# Patient Record
Sex: Male | Born: 1990 | Race: White | Hispanic: No | Marital: Married | Smoking: Current every day smoker
Health system: Southern US, Community
[De-identification: ages and names within clinical notes are randomized; demographics above are authoritative.]

## PROBLEM LIST (undated history)

## (undated) DIAGNOSIS — G8929 Other chronic pain: Secondary | ICD-10-CM

## (undated) DIAGNOSIS — J45909 Unspecified asthma, uncomplicated: Secondary | ICD-10-CM

## (undated) DIAGNOSIS — M549 Dorsalgia, unspecified: Secondary | ICD-10-CM

## (undated) HISTORY — PX: KNEE SURGERY: SHX244

## (undated) HISTORY — PX: OTHER SURGICAL HISTORY: SHX169

---

## 2013-05-22 ENCOUNTER — Emergency Department: Payer: Self-pay | Admitting: Emergency Medicine

## 2013-07-13 ENCOUNTER — Emergency Department: Payer: Self-pay | Admitting: Emergency Medicine

## 2013-07-14 LAB — GC/CHLAMYDIA PROBE AMP

## 2013-09-03 ENCOUNTER — Emergency Department: Payer: Self-pay | Admitting: Emergency Medicine

## 2013-10-04 ENCOUNTER — Emergency Department: Payer: Self-pay | Admitting: Emergency Medicine

## 2013-12-10 ENCOUNTER — Emergency Department: Payer: Self-pay | Admitting: Emergency Medicine

## 2013-12-10 LAB — URINALYSIS, COMPLETE
BACTERIA: NONE SEEN
Bilirubin,UR: NEGATIVE
Glucose,UR: NEGATIVE mg/dL (ref 0–75)
LEUKOCYTE ESTERASE: NEGATIVE
Nitrite: NEGATIVE
PH: 5 (ref 4.5–8.0)
Specific Gravity: 1.029 (ref 1.003–1.030)
Squamous Epithelial: 2
WBC UR: 19 /HPF (ref 0–5)

## 2013-12-10 LAB — COMPREHENSIVE METABOLIC PANEL
Albumin: 4.4 g/dL (ref 3.4–5.0)
Alkaline Phosphatase: 76 U/L
Anion Gap: 4 — ABNORMAL LOW (ref 7–16)
BUN: 17 mg/dL (ref 7–18)
Bilirubin,Total: 0.6 mg/dL (ref 0.2–1.0)
CHLORIDE: 106 mmol/L (ref 98–107)
Calcium, Total: 9.2 mg/dL (ref 8.5–10.1)
Co2: 26 mmol/L (ref 21–32)
Creatinine: 1.4 mg/dL — ABNORMAL HIGH (ref 0.60–1.30)
EGFR (African American): >60
EGFR (Non-African Amer.): >60
GLUCOSE: 122 mg/dL — AB (ref 65–99)
OSMOLALITY: 275 (ref 275–301)
Potassium: 3.4 mmol/L — ABNORMAL LOW (ref 3.5–5.1)
SGOT(AST): 25 U/L (ref 15–37)
SGPT (ALT): 20 U/L (ref 12–78)
SODIUM: 136 mmol/L (ref 136–145)
TOTAL PROTEIN: 7.6 g/dL (ref 6.4–8.2)

## 2013-12-10 LAB — CBC WITH DIFFERENTIAL/PLATELET
BASOS ABS: 0.1 10*3/uL (ref 0.0–0.1)
BASOS PCT: 0.9 %
EOS ABS: 0.3 10*3/uL (ref 0.0–0.7)
Eosinophil %: 2.6 %
HCT: 45.4 % (ref 40.0–52.0)
HGB: 15 g/dL (ref 13.0–18.0)
LYMPHS ABS: 3.4 10*3/uL (ref 1.0–3.6)
Lymphocyte %: 30.4 %
MCH: 28.3 pg (ref 26.0–34.0)
MCHC: 33.1 g/dL (ref 32.0–36.0)
MCV: 85 fL (ref 80–100)
MONOS PCT: 7.1 %
Monocyte #: 0.8 x10 3/mm (ref 0.2–1.0)
NEUTROS ABS: 6.5 10*3/uL (ref 1.4–6.5)
NEUTROS PCT: 59 %
Platelet: 265 10*3/uL (ref 150–440)
RBC: 5.32 10*6/uL (ref 4.40–5.90)
RDW: 13.3 % (ref 11.5–14.5)
WBC: 11.1 10*3/uL — AB (ref 3.8–10.6)

## 2013-12-21 ENCOUNTER — Emergency Department: Payer: Self-pay

## 2013-12-21 LAB — URINALYSIS, COMPLETE
Bacteria: NONE SEEN
Bilirubin,UR: NEGATIVE
GLUCOSE, UR: NEGATIVE mg/dL (ref 0–75)
KETONE: NEGATIVE
Leukocyte Esterase: NEGATIVE
NITRITE: NEGATIVE
Ph: 6 (ref 4.5–8.0)
Protein: NEGATIVE
SPECIFIC GRAVITY: 1.023 (ref 1.003–1.030)
Squamous Epithelial: NONE SEEN
WBC UR: 1 /HPF (ref 0–5)

## 2014-01-08 ENCOUNTER — Emergency Department: Payer: Self-pay | Admitting: Emergency Medicine

## 2014-01-08 LAB — CBC WITH DIFFERENTIAL/PLATELET
BASOS ABS: 0.1 10*3/uL (ref 0.0–0.1)
Basophil %: 1.1 %
Eosinophil #: 0.4 10*3/uL (ref 0.0–0.7)
Eosinophil %: 4.6 %
HCT: 45.4 % (ref 40.0–52.0)
HGB: 15.1 g/dL (ref 13.0–18.0)
Lymphocyte #: 2.5 10*3/uL (ref 1.0–3.6)
Lymphocyte %: 29.5 %
MCH: 28.2 pg (ref 26.0–34.0)
MCHC: 33.3 g/dL (ref 32.0–36.0)
MCV: 85 fL (ref 80–100)
Monocyte #: 0.7 x10 3/mm (ref 0.2–1.0)
Monocyte %: 8 %
NEUTROS ABS: 4.8 10*3/uL (ref 1.4–6.5)
Neutrophil %: 56.8 %
PLATELETS: 204 10*3/uL (ref 150–440)
RBC: 5.38 10*6/uL (ref 4.40–5.90)
RDW: 13.6 % (ref 11.5–14.5)
WBC: 8.5 10*3/uL (ref 3.8–10.6)

## 2014-01-08 LAB — COMPREHENSIVE METABOLIC PANEL
ALK PHOS: 76 U/L
Albumin: 4.2 g/dL (ref 3.4–5.0)
Anion Gap: 8 (ref 7–16)
BUN: 18 mg/dL (ref 7–18)
Bilirubin,Total: 0.3 mg/dL (ref 0.2–1.0)
CALCIUM: 9.1 mg/dL (ref 8.5–10.1)
CREATININE: 0.95 mg/dL (ref 0.60–1.30)
Chloride: 103 mmol/L (ref 98–107)
Co2: 29 mmol/L (ref 21–32)
EGFR (African American): >60
Glucose: 87 mg/dL (ref 65–99)
OSMOLALITY: 281 (ref 275–301)
POTASSIUM: 3.7 mmol/L (ref 3.5–5.1)
SGOT(AST): 19 U/L (ref 15–37)
SGPT (ALT): 25 U/L (ref 12–78)
Sodium: 140 mmol/L (ref 136–145)
TOTAL PROTEIN: 7.1 g/dL (ref 6.4–8.2)

## 2014-01-08 LAB — URINALYSIS, COMPLETE
BILIRUBIN, UR: NEGATIVE
Bacteria: NONE SEEN
Blood: NEGATIVE
GLUCOSE, UR: NEGATIVE mg/dL (ref 0–75)
Ketone: NEGATIVE
LEUKOCYTE ESTERASE: NEGATIVE
Nitrite: NEGATIVE
PROTEIN: NEGATIVE
Ph: 6 (ref 4.5–8.0)
RBC,UR: 1 /HPF (ref 0–5)
Specific Gravity: 1.017 (ref 1.003–1.030)
Squamous Epithelial: NONE SEEN
WBC UR: NONE SEEN /HPF (ref 0–5)

## 2014-01-12 ENCOUNTER — Emergency Department: Payer: Self-pay | Admitting: Emergency Medicine

## 2014-01-22 ENCOUNTER — Emergency Department: Payer: Self-pay | Admitting: Emergency Medicine

## 2014-01-24 ENCOUNTER — Emergency Department: Payer: Self-pay | Admitting: Emergency Medicine

## 2014-01-28 ENCOUNTER — Emergency Department: Payer: Self-pay | Admitting: Emergency Medicine

## 2014-01-28 LAB — COMPREHENSIVE METABOLIC PANEL
ALBUMIN: 4 g/dL (ref 3.4–5.0)
ALT: 26 U/L (ref 12–78)
ANION GAP: 9 (ref 7–16)
AST: 26 U/L (ref 15–37)
Alkaline Phosphatase: 78 U/L
BUN: 18 mg/dL (ref 7–18)
Bilirubin,Total: 0.4 mg/dL (ref 0.2–1.0)
CO2: 25 mmol/L (ref 21–32)
CREATININE: 1.36 mg/dL — AB (ref 0.60–1.30)
Calcium, Total: 8.4 mg/dL — ABNORMAL LOW (ref 8.5–10.1)
Chloride: 105 mmol/L (ref 98–107)
EGFR (African American): >60
EGFR (Non-African Amer.): >60
GLUCOSE: 118 mg/dL — AB (ref 65–99)
OSMOLALITY: 281 (ref 275–301)
Potassium: 3 mmol/L — ABNORMAL LOW (ref 3.5–5.1)
Sodium: 139 mmol/L (ref 136–145)
Total Protein: 7.4 g/dL (ref 6.4–8.2)

## 2014-01-28 LAB — CBC
HCT: 44.2 % (ref 40.0–52.0)
HGB: 15 g/dL (ref 13.0–18.0)
MCH: 28.8 pg (ref 26.0–34.0)
MCHC: 33.9 g/dL (ref 32.0–36.0)
MCV: 85 fL (ref 80–100)
Platelet: 233 10*3/uL (ref 150–440)
RBC: 5.19 10*6/uL (ref 4.40–5.90)
RDW: 13.7 % (ref 11.5–14.5)
WBC: 8.1 10*3/uL (ref 3.8–10.6)

## 2014-01-28 LAB — TSH: THYROID STIMULATING HORM: 1.64 u[IU]/mL

## 2014-01-28 LAB — ETHANOL
ETHANOL LVL: 26 mg/dL
Ethanol %: 0.026 % (ref 0.000–0.080)

## 2014-01-28 LAB — ACETAMINOPHEN LEVEL: Acetaminophen: 12 ug/mL

## 2014-01-28 LAB — SALICYLATE LEVEL: Salicylates, Serum: 2.7 mg/dL

## 2014-01-29 LAB — URINALYSIS, COMPLETE
BLOOD: NEGATIVE
Bilirubin,UR: NEGATIVE
Glucose,UR: NEGATIVE mg/dL (ref 0–75)
Ketone: NEGATIVE
Leukocyte Esterase: NEGATIVE
Nitrite: NEGATIVE
PH: 5 (ref 4.5–8.0)
Protein: NEGATIVE
RBC,UR: 1 /HPF (ref 0–5)
Specific Gravity: 1.029 (ref 1.003–1.030)
Squamous Epithelial: NONE SEEN

## 2014-01-29 LAB — DRUG SCREEN, URINE
AMPHETAMINES, UR SCREEN: NEGATIVE (ref ?–1000)
Barbiturates, Ur Screen: NEGATIVE (ref ?–200)
Benzodiazepine, Ur Scrn: NEGATIVE (ref ?–200)
COCAINE METABOLITE, UR ~~LOC~~: NEGATIVE (ref ?–300)
Cannabinoid 50 Ng, Ur ~~LOC~~: NEGATIVE (ref ?–50)
MDMA (Ecstasy)Ur Screen: NEGATIVE (ref ?–500)
Methadone, Ur Screen: NEGATIVE (ref ?–300)
Opiate, Ur Screen: POSITIVE (ref ?–300)
PHENCYCLIDINE (PCP) UR S: NEGATIVE (ref ?–25)
Tricyclic, Ur Screen: NEGATIVE (ref ?–1000)

## 2014-02-10 ENCOUNTER — Emergency Department: Payer: Self-pay | Admitting: Emergency Medicine

## 2014-04-27 ENCOUNTER — Encounter (HOSPITAL_COMMUNITY): Payer: Self-pay | Admitting: Emergency Medicine

## 2014-04-27 ENCOUNTER — Emergency Department (HOSPITAL_COMMUNITY)
Admission: EM | Admit: 2014-04-27 | Discharge: 2014-04-27 | Disposition: A | Discharge status: Home / Self Care | Payer: Self-pay | Attending: Emergency Medicine | Admitting: Emergency Medicine

## 2014-04-27 DIAGNOSIS — F10921 Alcohol use, unspecified with intoxication delirium: Secondary | ICD-10-CM

## 2014-04-27 DIAGNOSIS — F10121 Alcohol abuse with intoxication delirium: Secondary | ICD-10-CM | POA: Insufficient documentation

## 2014-04-27 DIAGNOSIS — Z720 Tobacco use: Secondary | ICD-10-CM | POA: Insufficient documentation

## 2014-04-27 DIAGNOSIS — J45909 Unspecified asthma, uncomplicated: Secondary | ICD-10-CM | POA: Insufficient documentation

## 2014-04-27 HISTORY — DX: Unspecified asthma, uncomplicated: J45.909

## 2014-04-27 LAB — CBC WITH DIFFERENTIAL/PLATELET
BASOS ABS: 0 10*3/uL (ref 0.0–0.1)
BASOS PCT: 0 % (ref 0–1)
EOS ABS: 0.2 10*3/uL (ref 0.0–0.7)
EOS PCT: 1 % (ref 0–5)
HEMATOCRIT: 44.1 % (ref 39.0–52.0)
Hemoglobin: 15.6 g/dL (ref 13.0–17.0)
Lymphocytes Relative: 12 % (ref 12–46)
Lymphs Abs: 1.5 10*3/uL (ref 0.7–4.0)
MCH: 30 pg (ref 26.0–34.0)
MCHC: 35.4 g/dL (ref 30.0–36.0)
MCV: 84.8 fL (ref 78.0–100.0)
MONO ABS: 0.6 10*3/uL (ref 0.1–1.0)
Monocytes Relative: 5 % (ref 3–12)
Neutro Abs: 10.5 10*3/uL — ABNORMAL HIGH (ref 1.7–7.7)
Neutrophils Relative %: 82 % — ABNORMAL HIGH (ref 43–77)
PLATELETS: 231 10*3/uL (ref 150–400)
RBC: 5.2 MIL/uL (ref 4.22–5.81)
RDW: 12.6 % (ref 11.5–15.5)
WBC: 12.7 10*3/uL — ABNORMAL HIGH (ref 4.0–10.5)

## 2014-04-27 LAB — ETHANOL: ALCOHOL ETHYL (B): 134 mg/dL — AB (ref 0–11)

## 2014-04-27 LAB — RAPID URINE DRUG SCREEN, HOSP PERFORMED
AMPHETAMINES: NOT DETECTED
Barbiturates: NOT DETECTED
Benzodiazepines: NOT DETECTED
Cocaine: NOT DETECTED
OPIATES: NOT DETECTED
Tetrahydrocannabinol: NOT DETECTED

## 2014-04-27 LAB — BASIC METABOLIC PANEL
ANION GAP: 13 (ref 5–15)
BUN: 16 mg/dL (ref 6–23)
CALCIUM: 9.7 mg/dL (ref 8.4–10.5)
CO2: 26 mEq/L (ref 19–32)
CREATININE: 1.06 mg/dL (ref 0.50–1.35)
Chloride: 104 mEq/L (ref 96–112)
Glucose, Bld: 140 mg/dL — ABNORMAL HIGH (ref 70–99)
Potassium: 4.1 mEq/L (ref 3.7–5.3)
SODIUM: 143 meq/L (ref 137–147)

## 2014-04-27 MED ORDER — SODIUM CHLORIDE 0.9 % IV SOLN
INTRAVENOUS | Status: DC
  Administered 2014-04-27: 05:00:00 via INTRAVENOUS

## 2014-04-27 MED ORDER — ONDANSETRON HCL 4 MG/2ML IJ SOLN
4.0000 mg | Freq: Once | INTRAMUSCULAR | Status: AC
  Administered 2014-04-27: 4 mg via INTRAVENOUS
  Filled 2014-04-27: qty 2

## 2014-04-27 NOTE — ED Provider Notes (Signed)
CSN: [id number]     Arrival date & time 04/27/14  0417 History   First MD Initiated Contact with Patient 04/27/14 0421     Chief Complaint  Patient presents with  . Altered Mental Status   HPI The patient presents to reassure him for altered mental status. The patient was at work this evening. After he finished work, he had a couple of shots of moonshine.  The patient got back to his apartment he became combative. His roommates called 911. EMS found that the patient was initially combative and then became somnolent.  Now that he is at the emergency room, he is alert and awake has no complaints. The patient does not remember becoming combative. The last thing he remembers was having shots with his boss at work. Denies any recent injuries. Has a mild headache but otherwise no complaints. Past Medical History  Diagnosis Date  . Asthma    Past Surgical History  Procedure Laterality Date  . Bicep surgery    . Knee surgery     No family history on file. History  Substance Use Topics  . Smoking status: Current Every Day Smoker -- 2.00 packs/day    Types: Cigarettes  . Smokeless tobacco: Not on file  . Alcohol Use: 4.2 oz/week    7 Cans of beer per week    Review of Systems  All other systems reviewed and are negative.     Allergies  Review of patient's allergies indicates no known allergies.  Home Medications   Prior to Admission medications   Not on File   BP 120/76  Pulse 83  Temp(Src) 97.6 F (36.4 C) (Oral)  Resp 16  SpO2 99% Physical Exam  Nursing note and vitals reviewed. Constitutional: He appears well-developed and well-nourished. No distress.  HENT:  Head: Normocephalic and atraumatic.  Right Ear: External ear normal.  Left Ear: External ear normal.  Eyes: Conjunctivae are normal. Right eye exhibits no discharge. Left eye exhibits no discharge. No scleral icterus.  Neck: Neck supple. No tracheal deviation present.  Cardiovascular: Normal rate, regular rhythm  and intact distal pulses.   Pulmonary/Chest: Effort normal and breath sounds normal. No stridor. No respiratory distress. He has no wheezes. He has no rales.  Abdominal: Soft. Bowel sounds are normal. He exhibits no distension. There is no tenderness. There is no rebound and no guarding.  Musculoskeletal: He exhibits no edema and no tenderness.  Neurological: He is alert. He has normal strength. No cranial nerve deficit (no facial droop, extraocular movements intact, no slurred speech) or sensory deficit. He exhibits normal muscle tone. He displays no seizure activity. Coordination normal.  Skin: Skin is warm and dry. No rash noted.  Psychiatric: He has a normal mood and affect.    ED Course  Procedures (including critical care time) Labs Review Labs Reviewed  CBC WITH DIFFERENTIAL - Abnormal; Notable for the following:    WBC 12.7 (*)    Neutrophils Relative % 82 (*)    Neutro Abs 10.5 (*)    All other components within normal limits  BASIC METABOLIC PANEL - Abnormal; Notable for the following:    Glucose, Bld 140 (*)    All other components within normal limits  ETHANOL - Abnormal; Notable for the following:    Alcohol, Ethyl (B) 134 (*)    All other components within normal limits  URINE RAPID DRUG SCREEN (HOSP PERFORMED)    0610  Pt is sleeping.  Will continue to monitor  MDM  Final diagnoses:  Alcohol intoxication, with delirium    Pt is intoxicated.  Alert and awake.  No complaints when he arrived in the ED.  Behavior earlier likely related to his acute intoxication.    Will monitor in the ED until he is sober and able to be safely discharged   Linwood DibblesJon Jesslynn Kruck, MD 04/27/14 [phone]

## 2014-04-27 NOTE — ED Notes (Signed)
Left wrist 18 G IV removed per protocol. Clean, dry, intact at removal time.

## 2014-04-27 NOTE — Discharge Instructions (Signed)
Alcohol Intoxication  Alcohol intoxication occurs when the amount of alcohol that a person has consumed impairs his or her ability to mentally and physically function. Alcohol directly impairs the normal chemical activity of the brain. Drinking large amounts of alcohol can lead to changes in mental function and behavior, and it can cause many physical effects that can be harmful.   Alcohol intoxication can range in severity from mild to very severe. Various factors can affect the level of intoxication that occurs, such as the person's age, gender, weight, frequency of alcohol consumption, and the presence of other medical conditions (such as diabetes, seizures, or heart conditions). Dangerous levels of alcohol intoxication may occur when people drink large amounts of alcohol in a short period (binge drinking). Alcohol can also be especially dangerous when combined with certain prescription medicines or "recreational" drugs.  SIGNS AND SYMPTOMS  Some common signs and symptoms of mild alcohol intoxication include:  · Loss of coordination.  · Changes in mood and behavior.  · Impaired judgment.  · Slurred speech.  As alcohol intoxication progresses to more severe levels, other signs and symptoms will appear. These may include:  · Vomiting.  · Confusion and impaired memory.  · Slowed breathing.  · Seizures.  · Loss of consciousness.  DIAGNOSIS   Your health care provider will take a medical history and perform a physical exam. You will be asked about the amount and type of alcohol you have consumed. Blood tests will be done to measure the concentration of alcohol in your blood. In many places, your blood alcohol level must be lower than 80 mg/dL (0.08%) to legally drive. However, many dangerous effects of alcohol can occur at much lower levels.   TREATMENT   People with alcohol intoxication often do not require treatment. Most of the effects of alcohol intoxication are temporary, and they go away as the alcohol naturally  leaves the body. Your health care provider will monitor your condition until you are stable enough to go home. Fluids are sometimes given through an IV access tube to help prevent dehydration.   HOME CARE INSTRUCTIONS  · Do not drive after drinking alcohol.  · Stay hydrated. Drink enough water and fluids to keep your urine clear or pale yellow. Avoid caffeine.    · Only take over-the-counter or prescription medicines as directed by your health care provider.    SEEK MEDICAL CARE IF:   · You have persistent vomiting.    · You do not feel better after a few days.  · You have frequent alcohol intoxication. Your health care provider can help determine if you should see a substance use treatment counselor.  SEEK IMMEDIATE MEDICAL CARE IF:   · You become shaky or tremble when you try to stop drinking.    · You shake uncontrollably (seizure).    · You throw up (vomit) blood. This may be bright red or may look like black coffee grounds.    · You have blood in your stool. This may be bright red or may appear as a black, tarry, bad smelling stool.    · You become lightheaded or faint.    MAKE SURE YOU:   · Understand these instructions.  · Will watch your condition.  · Will get help right away if you are not doing well or get worse.  Document Released: 04/08/2005 Document Revised: 03/01/2013 Document Reviewed: 12/02/2012  Exit[employer]® Patient Information ©2015 [employer]. This information is not intended to replace advice given to you by your health care provider. Make sure   you discuss any questions you have with your health care provider.

## 2014-04-27 NOTE — Progress Notes (Signed)
[facility] Stacy, ° °Did not get to see patient but will be sending information about [facility] Orange Card program to help patient establish primary care, using the address provided.  °

## 2014-04-27 NOTE — ED Notes (Signed)
Pt. Asleep and unable to obtain urine at this time.

## 2014-04-27 NOTE — ED Provider Notes (Signed)
8:00 AM  Assumed care from Dr. Lynelle DoctorKnapp.  Pt here with altered male status, likely from intoxication. Alcohol level was 1:30. Concerned patient may have been given medications or drugs. He was combative with girlfriend and roommate but is now cooperative and calm. Reports he does not remember the events. Neurologically intact. Hemodynamically stable. We'll ambulate, by mouth challenge. Urine drug screen pending.   9:05 AM  Pt has tolerated by mouth, ambulating without difficulty. Urine drug screen is negative. He is still neurologically intact, oriented x3. I feel he is safe to be discharged home.  Layla MawKristen N Aiyden Lauderback, DO 04/27/14 [phone]

## 2014-04-27 NOTE — ED Notes (Signed)
Pt ambulated in hallway without assistance, sts he feels weak. Pt then reports he doesn't remember most of the last night, only that he had two shots. Pt sts he usually drinks a lot ("case or two every night") but never "blacked out like this".

## 2014-04-27 NOTE — ED Notes (Signed)
Bed: [id number] Expected date:  Expected time:  Means of arrival:  Comments: EMS/combative and now lethargic

## 2014-05-12 ENCOUNTER — Emergency Department: Payer: Self-pay | Admitting: Emergency Medicine

## 2014-06-14 ENCOUNTER — Emergency Department: Payer: Self-pay | Admitting: Emergency Medicine

## 2014-07-18 ENCOUNTER — Emergency Department: Payer: Self-pay | Admitting: Emergency Medicine

## 2014-07-21 ENCOUNTER — Emergency Department: Payer: Self-pay | Admitting: Emergency Medicine

## 2014-09-02 ENCOUNTER — Emergency Department: Payer: Self-pay | Admitting: Emergency Medicine

## 2016-04-09 IMAGING — CT CT STONE STUDY
1 of 4 series · 5 of 46 positions shown, 10 images · non-contrast
Comparison: None.

CLINICAL DATA: Acute onset right flank pain with hematuria.

EXAM:
CT ABDOMEN AND PELVIS WITHOUT CONTRAST
TECHNIQUE: Multidetector CT imaging of the abdomen and pelvis was performed
following the standard protocol without IV contrast.

[Series 4: lung windows · axial · 0.76mm/px · z∈[-786,-700]mm · 5 of 27 slices shown, 10 images]
[im 5/27  soft-tissue]
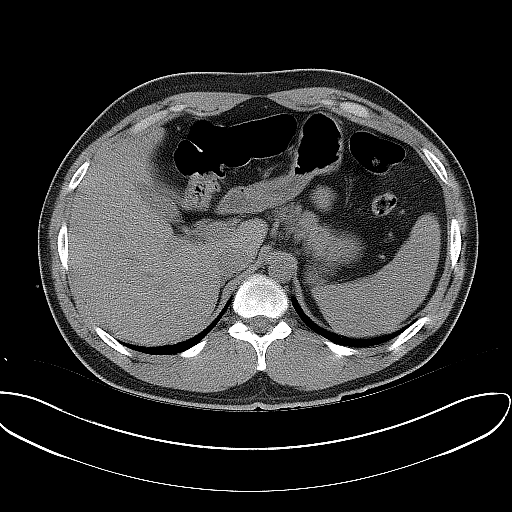
[im 5/27  bone]
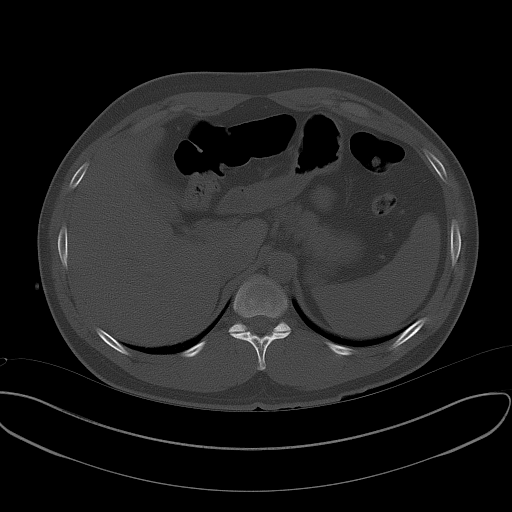
[im 9/27  soft-tissue]
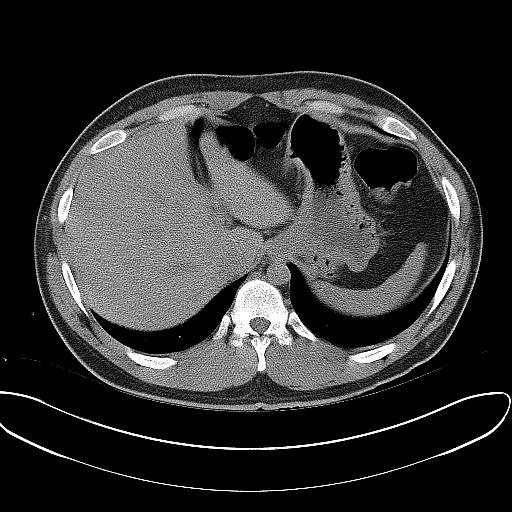
[im 9/27  lung]
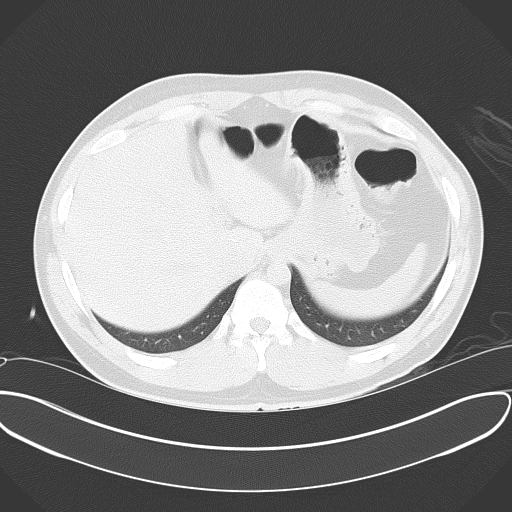
[im 14/27  soft-tissue]
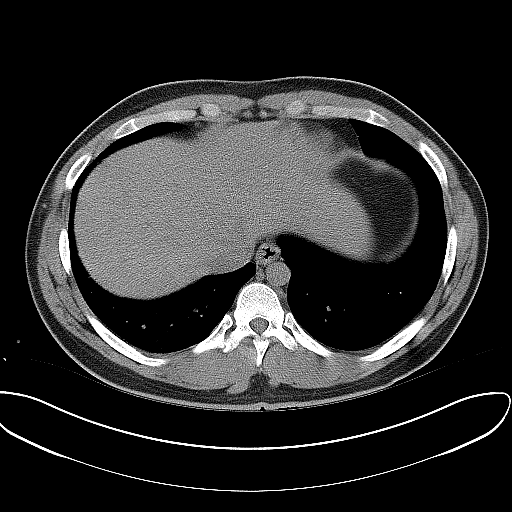
[im 14/27  lung]
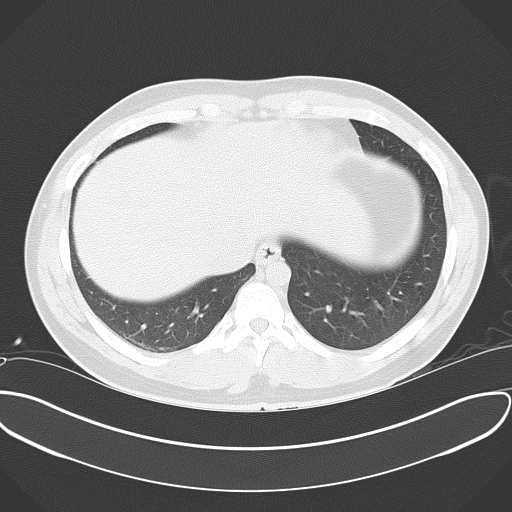
[im 18/27  soft-tissue]
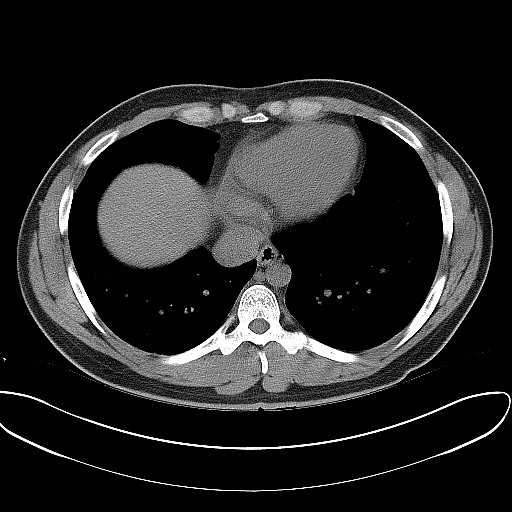
[im 18/27  lung]
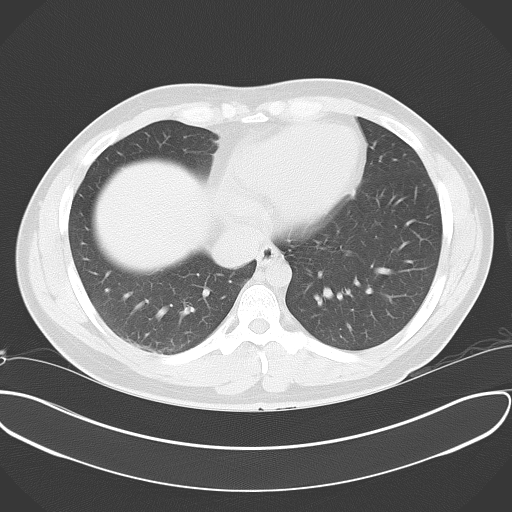
[im 22/27  soft-tissue]
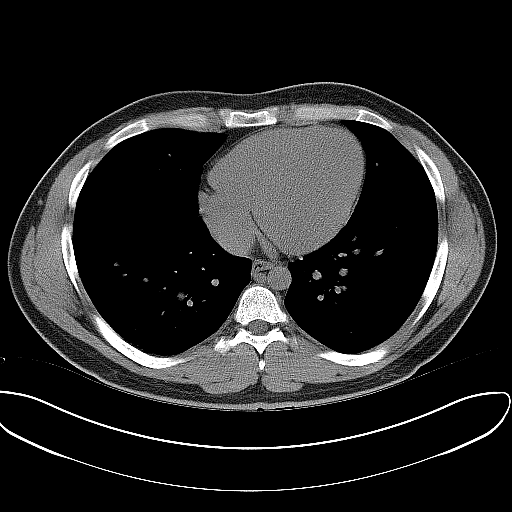
[im 22/27  lung]
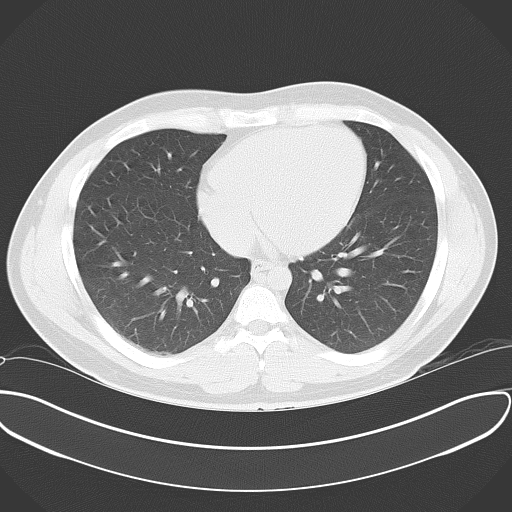

[5 of 46 positions shown; findings below may reference images not displayed]

FINDINGS: BODY WALL: Unremarkable.

LOWER CHEST: Unremarkable.

ABDOMEN/PELVIS:

Liver: No focal abnormality.

Biliary: No evidence of biliary obstruction or stone.

Pancreas: Unremarkable.

Spleen: Unremarkable.

Adrenals: Unremarkable.

Kidneys and ureters: 5 mm upper to mid right ureteric calculus
causing mild hydronephrosis and renal cortical edema. There is a
punctate calculus in the lower pole right kidney.

Bladder: Unremarkable.

Reproductive: Unremarkable.

Bowel: No obstruction. Normal appendix.

Retroperitoneum: No mass or adenopathy.

Peritoneum: No free fluid or gas.

Vascular: No acute abnormality.

OSSEOUS: No acute abnormalities.
IMPRESSION: 1. 5 mm upper to mid right ureteric stone causing mild
hydronephrosis.
2. 2 mm right renal calculus.

## 2018-03-20 ENCOUNTER — Encounter: Payer: Self-pay | Admitting: Emergency Medicine

## 2018-03-20 ENCOUNTER — Emergency Department: Payer: Medicaid - Out of State

## 2018-03-20 ENCOUNTER — Emergency Department
Admission: EM | Admit: 2018-03-20 | Discharge: 2018-03-20 | Disposition: A | Discharge status: Home / Self Care | Payer: Medicaid - Out of State | Attending: Emergency Medicine | Admitting: Emergency Medicine

## 2018-03-20 DIAGNOSIS — S6991XA Unspecified injury of right wrist, hand and finger(s), initial encounter: Secondary | ICD-10-CM | POA: Insufficient documentation

## 2018-03-20 DIAGNOSIS — J45909 Unspecified asthma, uncomplicated: Secondary | ICD-10-CM | POA: Diagnosis not present

## 2018-03-20 DIAGNOSIS — F17210 Nicotine dependence, cigarettes, uncomplicated: Secondary | ICD-10-CM | POA: Insufficient documentation

## 2018-03-20 DIAGNOSIS — Y9289 Other specified places as the place of occurrence of the external cause: Secondary | ICD-10-CM | POA: Diagnosis not present

## 2018-03-20 DIAGNOSIS — W2209XA Striking against other stationary object, initial encounter: Secondary | ICD-10-CM | POA: Insufficient documentation

## 2018-03-20 DIAGNOSIS — Y999 Unspecified external cause status: Secondary | ICD-10-CM | POA: Insufficient documentation

## 2018-03-20 DIAGNOSIS — Y9389 Activity, other specified: Secondary | ICD-10-CM | POA: Insufficient documentation

## 2018-03-20 HISTORY — DX: Other chronic pain: G89.29

## 2018-03-20 HISTORY — DX: Dorsalgia, unspecified: M54.9

## 2018-03-20 MED ORDER — MELOXICAM 15 MG PO TABS: 15.0000 mg | ORAL_TABLET | Freq: Every day | ORAL | 0 refills | Status: DC

## 2018-03-20 MED ORDER — HYDROCODONE-ACETAMINOPHEN 5-325 MG PO TABS
1.0000 | ORAL_TABLET | Freq: Once | ORAL | Status: AC
  Administered 2018-03-20: 1 via ORAL
  Filled 2018-03-20: qty 1

## 2018-03-20 NOTE — ED Provider Notes (Addendum)
[facility] Emergency Department Provider Note  ____________________________________________  Time seen: Approximately 7:57 PM  I have reviewed the triage vital signs and the nursing notes.   HISTORY  Chief Complaint Hand Pain    HPI Alejandro York is a 27 y.o. male presents the emergency department complaining of right hand pain and swelling.  Patient reports today he was  angry, punched a door with a closed fist.  Patient reports that since then he has had pain, swelling around the knuckles.  He states that he is unable to extend and flex the second through fifth digit.  He is able to move his thumb appropriately.  No other injury or complaint.  No history of previous hand injury.  No medications for this complaint prior to arrival.   Past Medical History:  Diagnosis Date  . Asthma   . Chronic back pain     There are no active problems to display for this patient.   Past Surgical History:  Procedure Laterality Date  . bicep surgery    . KNEE SURGERY      Prior to Admission medications   Medication Sig Start Date End Date Taking? Authorizing Provider  meloxicam (MOBIC) 15 MG tablet Take 1 tablet (15 mg total) by mouth daily. 03/20/18   Cuthriell, Delorise Royals, PA-C    Allergies Patient has no known allergies.  No family history on file.  Social History Social History   Tobacco Use  . Smoking status: Current Every Day Smoker    Packs/day: 0.50    Types: Cigarettes  . Smokeless tobacco: Never Used  Substance Use Topics  . Alcohol use: Yes    Alcohol/week: 7.0 standard drinks    Types: 7 Cans of beer per week  . Drug use: No     Review of Systems  Constitutional: No fever/chills Eyes: No visual changes. No discharge ENT: No upper respiratory complaints. Cardiovascular: no chest pain. Respiratory: no cough. No SOB. Gastrointestinal: No abdominal pain.  No nausea, no vomiting.   Musculoskeletal: Positive for right hand injury Skin:  Negative for rash, abrasions, lacerations, ecchymosis. Neurological: Negative for headaches, focal weakness or numbness. 10-point ROS otherwise negative.  ____________________________________________   PHYSICAL EXAM:  VITAL SIGNS: ED Triage Vitals  Enc Vitals Group     BP 03/20/18 1846 118/74     Pulse Rate 03/20/18 1846 84     Resp 03/20/18 1846 18     Temp 03/20/18 1846 98.7 F (37.1 C)     Temp Source 03/20/18 1846 Oral     SpO2 03/20/18 1846 100 %     Weight 03/20/18 1846 183 lb (83 kg)     Height 03/20/18 1846 5\' 9"  (1.753 m)     Head Circumference --      Peak Flow --      Pain Score 03/20/18 1850 10     Pain Loc --      Pain Edu? --      Excl. in GC? --      Constitutional: Alert and oriented. Well appearing and in no acute distress. Eyes: Conjunctivae are normal. PERRL. EOMI. Head: Atraumatic. Neck: No stridor.    Cardiovascular: Normal rate, regular rhythm. Normal S1 and S2.  Good peripheral circulation. Respiratory: Normal respiratory effort without tachypnea or retractions. Lungs CTAB. Good air entry to the bases with no decreased or absent breath sounds. Musculoskeletal: Full range of motion to all extremities. No gross deformities appreciated.  No gross deformity appreciated to the right  hand.  Patient does have some mild ecchymosis, edema around the MCP joint of the index through little finger.  Patient is exquisitely tender to palpation throughout this region with no significant palpable abnormality.  Patient will not extend or flex his digits.  Patient will not allow passive extension or flexion of the digits either.  Sensation intact all 5 digits.  Capillary refill less than 2 seconds all digits. Neurologic:  Normal speech and language. No gross focal neurologic deficits are appreciated.  Skin:  Skin is warm, dry and intact. No rash noted. Psychiatric: Mood and affect are normal. Speech and behavior are normal. Patient exhibits appropriate insight and  judgement.   ____________________________________________   LABS (all labs ordered are listed, but only abnormal results are displayed)  Labs Reviewed - No data to display ____________________________________________  EKG   ____________________________________________  RADIOLOGY I personally viewed and evaluated these images as part of my medical decision making, as well as reviewing the written report by the radiologist.  Concur with radiologist finding of no acute osseous abnormality to the hand.  Dg Hand Complete Right  Result Date: 03/20/2018 CLINICAL DATA:  Acute RIGHT hand pain following injury today. Initial encounter. EXAM: RIGHT HAND - COMPLETE 3+ VIEW COMPARISON:  05/12/2014 radiographs FINDINGS: There is no evidence of fracture or dislocation. There is no evidence of arthropathy or other focal bone abnormality. Soft tissues are unremarkable. IMPRESSION: Negative. Electronically Signed   By: Harmon Pier M.D.   On: 03/20/2018 19:49    ____________________________________________    PROCEDURES  Procedure(s) performed:    Procedures    Medications  HYDROcodone-acetaminophen (NORCO/VICODIN) 5-325 MG per tablet 1 tablet (1 tablet Oral Given 03/20/18 2010)     ____________________________________________   INITIAL IMPRESSION / ASSESSMENT AND PLAN / ED COURSE  Pertinent labs & imaging results that were available during my care of the patient were reviewed by me and considered in my medical decision making (see chart for details).  Review of the [place] CSRS was performed in accordance of the [facility] prior to dispensing any controlled drugs.      Patient's diagnosis is consistent with hand injury.  Patient presents the emergency department after punching to the door with a closed fist.  Patient would not perform active or passive range of motion of the index through little finger right hand.  I have explained why this is necessary to the patient, he still not perform any  range of motion.  As such, hand is splinted, he will be referred to hand surgery for further evaluation for possible ligamentous injury.  At this point, visualization of the hand revealed mild edema, minimal ecchymosis but no significant indication of injury consistent with reported dysfunction of 4 digits.  Likelihood of rupture for extensor tendons from this type of injury is extremely low.  However, as patient will not allow detailed examination with active or passive range of motion, his hand is splinted in position, referred to hand surgery..  Patient is to be prescribed meloxicam for symptom relief.  Patient is given ED precautions to return to the ED for any worsening or new symptoms.     ____________________________________________  FINAL CLINICAL IMPRESSION(S) / ED DIAGNOSES  Final diagnoses:  Injury of right hand, initial encounter      NEW MEDICATIONS STARTED DURING THIS VISIT:  ED Discharge Orders         Ordered    meloxicam (MOBIC) 15 MG tablet  Daily     03/20/18 2007  This chart was dictated using voice recognition software/Dragon. Despite best efforts to proofread, errors can occur which can change the meaning. Any change was purely unintentional.    Racheal Patches, PA-C 03/20/18 2008    Lanette Hampshire 03/20/18 2012    Arnaldo Natal, MD 03/21/18 [phone]

## 2018-03-20 NOTE — ED Notes (Signed)
ED tech to bedside to place splint.

## 2018-03-20 NOTE — ED Triage Notes (Signed)
Patient presents to the ED with right hand pain after punching a door approx. 1 hour ago.  Hand appears swollen.

## 2018-04-13 ENCOUNTER — Encounter: Payer: Self-pay | Admitting: Emergency Medicine

## 2018-04-13 ENCOUNTER — Other Ambulatory Visit: Payer: Self-pay

## 2018-04-13 ENCOUNTER — Emergency Department: Payer: Medicaid - Out of State

## 2018-04-13 ENCOUNTER — Emergency Department
Admission: EM | Admit: 2018-04-13 | Discharge: 2018-04-13 | Disposition: A | Discharge status: Home / Self Care | Payer: Medicaid - Out of State | Attending: Emergency Medicine | Admitting: Emergency Medicine

## 2018-04-13 DIAGNOSIS — Z79899 Other long term (current) drug therapy: Secondary | ICD-10-CM | POA: Insufficient documentation

## 2018-04-13 DIAGNOSIS — B9789 Other viral agents as the cause of diseases classified elsewhere: Secondary | ICD-10-CM | POA: Insufficient documentation

## 2018-04-13 DIAGNOSIS — J45909 Unspecified asthma, uncomplicated: Secondary | ICD-10-CM | POA: Insufficient documentation

## 2018-04-13 DIAGNOSIS — J069 Acute upper respiratory infection, unspecified: Secondary | ICD-10-CM | POA: Insufficient documentation

## 2018-04-13 DIAGNOSIS — E860 Dehydration: Secondary | ICD-10-CM | POA: Diagnosis not present

## 2018-04-13 DIAGNOSIS — R05 Cough: Secondary | ICD-10-CM | POA: Diagnosis present

## 2018-04-13 DIAGNOSIS — F17210 Nicotine dependence, cigarettes, uncomplicated: Secondary | ICD-10-CM | POA: Insufficient documentation

## 2018-04-13 DIAGNOSIS — B370 Candidal stomatitis: Secondary | ICD-10-CM | POA: Diagnosis not present

## 2018-04-13 LAB — URINALYSIS, COMPLETE (UACMP) WITH MICROSCOPIC
Bacteria, UA: NONE SEEN
Bilirubin Urine: NEGATIVE
Glucose, UA: NEGATIVE mg/dL
HGB URINE DIPSTICK: NEGATIVE
KETONES UR: 80 mg/dL — AB
Leukocytes, UA: NEGATIVE
Nitrite: NEGATIVE
PH: 5 (ref 5.0–8.0)
Protein, ur: NEGATIVE mg/dL
SPECIFIC GRAVITY, URINE: 1.028 (ref 1.005–1.030)

## 2018-04-13 LAB — URINE DRUG SCREEN, QUALITATIVE (ARMC ONLY)
Amphetamines, Ur Screen: POSITIVE — AB
BARBITURATES, UR SCREEN: NOT DETECTED
BENZODIAZEPINE, UR SCRN: NOT DETECTED
COCAINE METABOLITE, UR ~~LOC~~: NOT DETECTED
Cannabinoid 50 Ng, Ur ~~LOC~~: NOT DETECTED
MDMA (Ecstasy)Ur Screen: NOT DETECTED
Methadone Scn, Ur: NOT DETECTED
OPIATE, UR SCREEN: NOT DETECTED
PHENCYCLIDINE (PCP) UR S: NOT DETECTED
Tricyclic, Ur Screen: NOT DETECTED

## 2018-04-13 LAB — CBC WITH DIFFERENTIAL/PLATELET
BASOS ABS: 0 10*3/uL (ref 0–0.1)
Basophils Relative: 1 %
EOS PCT: 1 %
Eosinophils Absolute: 0.1 10*3/uL (ref 0–0.7)
HCT: 43.1 % (ref 40.0–52.0)
Hemoglobin: 15.6 g/dL (ref 13.0–18.0)
LYMPHS PCT: 16 %
Lymphs Abs: 0.9 10*3/uL — ABNORMAL LOW (ref 1.0–3.6)
MCH: 31 pg (ref 26.0–34.0)
MCHC: 36.2 g/dL — ABNORMAL HIGH (ref 32.0–36.0)
MCV: 85.7 fL (ref 80.0–100.0)
Monocytes Absolute: 0.7 10*3/uL (ref 0.2–1.0)
Monocytes Relative: 12 %
Neutro Abs: 4 10*3/uL (ref 1.4–6.5)
Neutrophils Relative %: 70 %
PLATELETS: 207 10*3/uL (ref 150–440)
RBC: 5.03 MIL/uL (ref 4.40–5.90)
RDW: 13.5 % (ref 11.5–14.5)
WBC: 5.7 10*3/uL (ref 3.8–10.6)

## 2018-04-13 LAB — BASIC METABOLIC PANEL
ANION GAP: 11 (ref 5–15)
BUN: 19 mg/dL (ref 6–20)
CALCIUM: 9.1 mg/dL (ref 8.9–10.3)
CO2: 26 mmol/L (ref 22–32)
Chloride: 101 mmol/L (ref 98–111)
Creatinine, Ser: 0.81 mg/dL (ref 0.61–1.24)
GFR calc Af Amer: >60 mL/min (ref >60)
Glucose, Bld: 92 mg/dL (ref 70–99)
POTASSIUM: 4.2 mmol/L (ref 3.5–5.1)
Sodium: 138 mmol/L (ref 135–145)

## 2018-04-13 MED ORDER — NYSTATIN 100000 UNIT/ML MT SUSP: 5.0000 mL | Freq: Four times a day (QID) | OROMUCOSAL | 0 refills | Status: DC

## 2018-04-13 MED ORDER — BENZONATATE 100 MG PO CAPS: 200.0000 mg | ORAL_CAPSULE | Freq: Three times a day (TID) | ORAL | 0 refills | Status: AC | PRN

## 2018-04-13 MED ORDER — SODIUM CHLORIDE 0.9 % IV BOLUS
1000.0000 mL | Freq: Once | INTRAVENOUS | Status: AC
  Administered 2018-04-13: 1000 mL via INTRAVENOUS

## 2018-04-13 MED ORDER — LIDOCAINE VISCOUS HCL 2 % MT SOLN: 5.0000 mL | Freq: Four times a day (QID) | OROMUCOSAL | 0 refills | Status: DC | PRN

## 2018-04-13 MED ORDER — TRAMADOL HCL 50 MG PO TABS
50.0000 mg | ORAL_TABLET | Freq: Once | ORAL | Status: AC
  Administered 2018-04-13: 50 mg via ORAL
  Filled 2018-04-13: qty 1

## 2018-04-13 MED ORDER — METHYLPREDNISOLONE 4 MG PO TBPK: ORAL_TABLET | ORAL | 0 refills | Status: DC

## 2018-04-13 MED ORDER — METHYLPREDNISOLONE SODIUM SUCC 125 MG IJ SOLR
80.0000 mg | Freq: Once | INTRAMUSCULAR | Status: AC
  Administered 2018-04-13: 80 mg via INTRAVENOUS
  Filled 2018-04-13: qty 2

## 2018-04-13 MED ORDER — BENZONATATE 100 MG PO CAPS
200.0000 mg | ORAL_CAPSULE | Freq: Once | ORAL | Status: AC
  Administered 2018-04-13: 200 mg via ORAL
  Filled 2018-04-13: qty 2

## 2018-04-13 NOTE — ED Triage Notes (Signed)
Pt here with c/o sore throat, mouth pain for the past 3 days, has been on antibiotics for spider bite which has caused thrush in mouth, has been unable to eat/drink for 3 days. Appears in NAD.

## 2018-04-13 NOTE — Discharge Instructions (Signed)
Follow discharge care instruction take medication as directed. °

## 2018-04-13 NOTE — ED Provider Notes (Signed)
[facility] Emergency Department Provider Note   ____________________________________________   First MD Initiated Contact with Patient 04/13/18 1259     (approximate)  I have reviewed the triage vital signs and the nursing notes.   HISTORY  Chief Complaint Cough and Sore Throat    HPI Alejandro York is a 27 y.o. male patient presents with mouth pain, sore throat, oral thrush,and dysphagia.  Patient recently had 2 course of antibiotics first antibiotics was for dental infection and a second round of antibiotics was for spider bite.  Last dose this was taken 3 days ago.  Patient states decreased fluid and food intake secondary to thrush and sore throat.  Patient also admits to IV drug use.  No palliative measures for complaint.  Past Medical History:  Diagnosis Date  . Asthma   . Chronic back pain     There are no active problems to display for this patient.   Past Surgical History:  Procedure Laterality Date  . bicep surgery    . KNEE SURGERY      Prior to Admission medications   Medication Sig Start Date End Date Taking? Authorizing Provider  benzonatate (TESSALON PERLES) 100 MG capsule Take 2 capsules (200 mg total) by mouth 3 (three) times daily as needed. 04/13/18 04/13/19  Joni Reining, PA-C  lidocaine (XYLOCAINE) 2 % solution Use as directed 5 mLs in the mouth or throat every 6 (six) hours as needed for mouth pain. Oral swish and swallow. 04/13/18   Joni Reining, PA-C  meloxicam (MOBIC) 15 MG tablet Take 1 tablet (15 mg total) by mouth daily. 03/20/18   Cuthriell, Delorise Royals, PA-C  methylPREDNISolone (MEDROL DOSEPAK) 4 MG TBPK tablet Take Tapered dose as directed 04/13/18   Joni Reining, PA-C  nystatin (MYCOSTATIN) 100000 UNIT/ML suspension Take 5 mLs (500,000 Units total) by mouth 4 (four) times daily. 04/13/18   Joni Reining, PA-C    Allergies Patient has no known allergies.  No family history on file.  Social History Social  History   Tobacco Use  . Smoking status: Current Every Day Smoker    Packs/day: 0.50    Types: Cigarettes  . Smokeless tobacco: Never Used  Substance Use Topics  . Alcohol use: Yes    Alcohol/week: 7.0 standard drinks    Types: 7 Cans of beer per week  . Drug use: No    Review of Systems Constitutional: No fever/chills Eyes: No visual changes. ENT: Sore throat, coated tongue, and pain with swallowing.. Cardiovascular: Denies chest pain. Respiratory: Denies shortness of breath. Gastrointestinal:  abdominal pain.  No nausea, no vomiting.  No diarrhea.  No constipation. Genitourinary: Negative for dysuria.  States blood in urine. Musculoskeletal: Negative for back pain. Skin: Negative for rash. Neurological: Negative for headaches, focal weakness or numbness.   ____________________________________________   PHYSICAL EXAM:  VITAL SIGNS: ED Triage Vitals  Enc Vitals Group     BP 04/13/18 1254 132/79     Pulse Rate 04/13/18 1254 (!) 103     Resp 04/13/18 1254 16     Temp 04/13/18 1254 99.4 F (37.4 C)     Temp Source 04/13/18 1254 Oral     SpO2 04/13/18 1254 99 %     Weight 04/13/18 1255 170 lb (77.1 kg)     Height 04/13/18 1255 5\' 9"  (1.753 m)     Head Circumference --      Peak Flow --      Pain Score 04/13/18  1254 0     Pain Loc --      Pain Edu? --      Excl. in GC? --    Constitutional: Alert and oriented. Well appearing and in no acute distress. Eyes: Conjunctivae are normal. PERRL. EOMI. Head: Atraumatic. Nose: No congestion/rhinnorhea. Mouth/Throat: Mucous membranes are dry.  Oropharynx non-erythematous. Neck: No stridor.  Hematological/Lymphatic/Immunilogical: No cervical lymphadenopathy. Cardiovascular: Normal rate, regular rhythm. Grossly normal heart sounds.  Good peripheral circulation. Respiratory: Normal respiratory effort.  No retractions. Lungs CTAB. Gastrointestinal: Soft and nontender. No distention. No abdominal bruits. No CVA  tenderness. Genitourinary: Deferred Musculoskeletal: No lower extremity tenderness nor edema.  No joint effusions. Neurologic:  Normal speech and language. No gross focal neurologic deficits are appreciated. No gait instability. Skin:  Skin is warm, dry and intact. No rash noted. Psychiatric: Mood and affect are normal. Speech and behavior are normal.  ____________________________________________   LABS (all labs ordered are listed, but only abnormal results are displayed)  Labs Reviewed  CBC WITH DIFFERENTIAL/PLATELET - Abnormal; Notable for the following components:      Result Value   MCHC 36.2 (*)    Lymphs Abs 0.9 (*)    All other components within normal limits  URINALYSIS, COMPLETE (UACMP) WITH MICROSCOPIC - Abnormal; Notable for the following components:   Color, Urine AMBER (*)    APPearance CLEAR (*)    Ketones, ur 80 (*)    All other components within normal limits  URINE DRUG SCREEN, QUALITATIVE ([facility] ONLY) - Abnormal; Notable for the following components:   Amphetamines, Ur Screen POSITIVE (*)    All other components within normal limits  BASIC METABOLIC PANEL   ____________________________________________  EKG   ____________________________________________  RADIOLOGY  ED MD interpretation:  Official radiology report(s): Dg Chest 2 View  Result Date: 04/13/2018 CLINICAL DATA:  Sore throat and fever EXAM: CHEST - 2 VIEW COMPARISON:  None. FINDINGS: Lungs are clear. Heart size and pulmonary vascularity are normal. No adenopathy. No bone lesions. IMPRESSION: No edema or consolidation. Electronically Signed   By: Bretta Bang III M.D.   On: 04/13/2018 14:14    ____________________________________________   PROCEDURES  Procedure(s) performed: None  Procedures  Critical Care performed: No  ____________________________________________   INITIAL IMPRESSION / ASSESSMENT AND PLAN / ED COURSE  As part of my medical decision making, I reviewed the  following data within the electronic MEDICAL RECORD NUMBER    Patient presents with sore throat and mouth pain.  Patient also complaining of inability to tolerate food and fluids secondary to thrush.  Patient has completed 2 course of antibiotics.  Discussed labs and x-ray results with patient to include positive for amphetamines on urine drug screen.  Patient had 80+ ketones.  Patient was rehydrated with 1000 cc of normal saline.  Patient given discharge care instructions advised take medications as directed.  Patient advised follow-up with [facility].   ____________________________________________   FINAL CLINICAL IMPRESSION(S) / ED DIAGNOSES  Final diagnoses:  Viral URI with cough  Dehydration  Oral thrush     ED Discharge Orders         Ordered    benzonatate (TESSALON PERLES) 100 MG capsule  3 times daily PRN     04/13/18 1532    methylPREDNISolone (MEDROL DOSEPAK) 4 MG TBPK tablet     04/13/18 1532    lidocaine (XYLOCAINE) 2 % solution  Every 6 hours PRN     04/13/18 1532    nystatin (MYCOSTATIN) 100000 UNIT/ML  suspension  4 times daily     04/13/18 1535           Note:  This document was prepared using Dragon voice recognition software and may include unintentional dictation errors.    Joni Reining, PA-C 04/13/18 1544    Sharman Cheek, MD 04/16/18 [id number]

## 2018-04-13 NOTE — ED Notes (Signed)
Pt stated that hes still not able to go to the bathroom but was able to drink something and keep it down

## 2018-04-13 NOTE — ED Notes (Signed)
Pt denies having to urinate at present time. Urinal left at bedside. Visitor states they will notify staff when pt has urinated.

## 2018-04-13 NOTE — ED Notes (Signed)
See triage note presents with friend   States pain to mouth ,sore throat for about 5 days  Subjective fever at home   Chills/and sweating  Afebrile on arrival   Tongue is red and swollen  Pt is not speaking g/f is speaking for him

## 2018-04-13 NOTE — ED Notes (Signed)
Pt leaving with sister, Lennox Laity. Pt alert and oriented X4, active, cooperative, pt in NAD. RR even and unlabored, color WNL.  Pt informed to return if any life threatening symptoms occur.  Discharge and followup instructions reviewed.

## 2018-08-09 ENCOUNTER — Encounter: Payer: Self-pay | Admitting: Emergency Medicine

## 2018-08-09 ENCOUNTER — Emergency Department
Admission: EM | Admit: 2018-08-09 | Discharge: 2018-08-09 | Disposition: A | Discharge status: Home / Self Care | Payer: Medicaid - Out of State | Attending: Emergency Medicine | Admitting: Emergency Medicine

## 2018-08-09 ENCOUNTER — Other Ambulatory Visit: Payer: Self-pay

## 2018-08-09 DIAGNOSIS — J45909 Unspecified asthma, uncomplicated: Secondary | ICD-10-CM | POA: Diagnosis not present

## 2018-08-09 DIAGNOSIS — F17210 Nicotine dependence, cigarettes, uncomplicated: Secondary | ICD-10-CM | POA: Insufficient documentation

## 2018-08-09 DIAGNOSIS — H5713 Ocular pain, bilateral: Secondary | ICD-10-CM | POA: Diagnosis present

## 2018-08-09 DIAGNOSIS — Y929 Unspecified place or not applicable: Secondary | ICD-10-CM | POA: Insufficient documentation

## 2018-08-09 DIAGNOSIS — Z1810 Retained metal fragments, unspecified: Secondary | ICD-10-CM | POA: Insufficient documentation

## 2018-08-09 DIAGNOSIS — T1591XA Foreign body on external eye, part unspecified, right eye, initial encounter: Secondary | ICD-10-CM | POA: Diagnosis not present

## 2018-08-09 DIAGNOSIS — Y990 Civilian activity done for income or pay: Secondary | ICD-10-CM | POA: Diagnosis not present

## 2018-08-09 DIAGNOSIS — Y9389 Activity, other specified: Secondary | ICD-10-CM | POA: Insufficient documentation

## 2018-08-09 DIAGNOSIS — T1590XA Foreign body on external eye, part unspecified, unspecified eye, initial encounter: Secondary | ICD-10-CM

## 2018-08-09 DIAGNOSIS — T1592XA Foreign body on external eye, part unspecified, left eye, initial encounter: Secondary | ICD-10-CM | POA: Insufficient documentation

## 2018-08-09 DIAGNOSIS — X58XXXA Exposure to other specified factors, initial encounter: Secondary | ICD-10-CM | POA: Insufficient documentation

## 2018-08-09 MED ORDER — ERYTHROMYCIN 5 MG/GM OP OINT
TOPICAL_OINTMENT | Freq: Once | OPHTHALMIC | Status: AC
  Administered 2018-08-09: 1 via OPHTHALMIC
  Filled 2018-08-09: qty 2

## 2018-08-09 MED ORDER — ERYTHROMYCIN 5 MG/GM OP OINT: 1.0000 "application " | TOPICAL_OINTMENT | Freq: Four times a day (QID) | OPHTHALMIC | 0 refills | Status: DC

## 2018-08-09 MED ORDER — TETRACAINE HCL 0.5 % OP SOLN
2.0000 [drp] | Freq: Once | OPHTHALMIC | Status: AC
  Administered 2018-08-09: 2 [drp] via OPHTHALMIC
  Filled 2018-08-09: qty 4

## 2018-08-09 MED ORDER — EYE WASH OPHTH SOLN
1.0000 [drp] | OPHTHALMIC | Status: DC | PRN
  Filled 2018-08-09: qty 118

## 2018-08-09 MED ORDER — FLUORESCEIN SODIUM 1 MG OP STRP
1.0000 | ORAL_STRIP | Freq: Once | OPHTHALMIC | Status: AC
  Administered 2018-08-09: 1 via OPHTHALMIC
  Filled 2018-08-09: qty 1
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
## 2018-08-09 NOTE — Discharge Instructions (Signed)
You had a piece of metal in both eyes.  Both pieces of metal were removed in the emergency department.  There is still a hole where the metal was.  I have given you a prescription for antibiotic cream so that you do not get a bad infection.  Please follow-up with ophthalmology within 48 hours for eye recheck.

## 2018-08-09 NOTE — ED Triage Notes (Signed)
Eyes irritated since yesterday.  Works in [employer] on Saturday.

## 2018-08-09 NOTE — ED Provider Notes (Signed)
[facility] Emergency Department Provider Note  ____________________________________________  Time seen: Approximately 7:58 AM  I have reviewed the triage vital signs and the nursing notes.   HISTORY  Chief Complaint Eye Pain    HPI Alejandro York is a 28 y.o. male that presents to the emergency department for evaluation of bilateral eye pain, eye redness, photophobia since this morning. He has been having blurry vision and double vision. 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 He always wears safety glasses when working with metal. 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 He says he will get fired if he is near the welders or works grinding without protection. He does not recall any incidient while at work. He did not work on Sunday. He woke up yesterday morning, Monday, with symptoms.  Patient has been sick for the last week with a URI. Patient states that he is supposed to wear reading glasses and contacts and he wears neither. No vomiting.    Past Medical History:  Diagnosis Date  . Asthma   . Chronic back pain     There are no active problems to display for this patient.   Past Surgical History:  Procedure Laterality Date  . bicep surgery    . KNEE SURGERY      Prior to Admission medications   Medication Sig Start Date End Date Taking? Authorizing Provider  benzonatate (TESSALON PERLES) 100 MG capsule Take 2 capsules (200 mg total) by mouth 3 (three) times daily as needed. 04/13/18 04/13/19  Joni ReiningSmith, Ronald K, PA-C  erythromycin ophthalmic ointment Place 1 application into both eyes 4 (four) times daily. 08/09/18   Enid DerryWagner, Meryl Hubers, PA-C  lidocaine (XYLOCAINE) 2 % solution Use as directed 5 mLs in the mouth or throat every 6 (six) hours as needed for mouth pain. Oral swish and swallow. 04/13/18   Joni ReiningSmith, Ronald K, PA-C  meloxicam (MOBIC) 15 MG tablet Take 1 tablet (15 mg total) by mouth daily. 03/20/18   Cuthriell, Delorise RoyalsJonathan D,  PA-C  methylPREDNISolone (MEDROL DOSEPAK) 4 MG TBPK tablet Take Tapered dose as directed 04/13/18   Joni ReiningSmith, Ronald K, PA-C  nystatin (MYCOSTATIN) 100000 UNIT/ML suspension Take 5 mLs (500,000 Units total) by mouth 4 (four) times daily. 04/13/18   Joni ReiningSmith, Ronald K, PA-C    Allergies Patient has no known allergies.  No family history on file.  Social History Social History   Tobacco Use  . Smoking status: Current Every Day Smoker    Packs/day: 0.50    Types: Cigarettes  . Smokeless tobacco: Never Used  Substance Use Topics  . Alcohol use: Yes    Alcohol/week: 7.0 standard drinks    Types: 7 Cans of beer per week  . Drug use: No     Review of Systems  Constitutional: No fever/chills Cardiovascular: No chest pain. Respiratory: No SOB. Gastrointestinal: No nausea, no vomiting.  Musculoskeletal: Negative for musculoskeletal pain. Skin: Negative for rash, abrasions, lacerations, ecchymosis. Neurological: Negative for headaches   ____________________________________________   PHYSICAL EXAM:  VITAL SIGNS: ED Triage Vitals [08/09/18 0738]  Enc Vitals Group     BP 121/70     Pulse Rate 98     Resp 20     Temp 97.7 F (36.5 C)     Temp Source Oral     SpO2 97 %     Weight 155 lb (70.3 kg)     Height 5\' 9"  (1.753 m)     Head  Circumference      Peak Flow      Pain Score      Pain Loc      Pain Edu?      Excl. in GC?      Constitutional: Alert and oriented. Well appearing and in no acute distress. Eyes: Conjunctivae are injected bilaterally. PERRL. EOMI. Metal foreign body to 3 PM position of bilateral eyes. Head: Atraumatic. ENT:      Ears:      Nose: No congestion/rhinnorhea.      Mouth/Throat: Mucous membranes are moist.  Neck: No stridor.  Cardiovascular: Normal rate, regular rhythm.  Good peripheral circulation. Respiratory: Normal respiratory effort without tachypnea or retractions. Lungs CTAB. Good air entry to the bases with no decreased or absent breath  sounds. Gastrointestinal: Bowel sounds 4 quadrants. Soft and nontender to palpation. No guarding or rigidity. No palpable masses. No distention.  Musculoskeletal: Full range of motion to all extremities. No gross deformities appreciated. Neurologic:  Normal speech and language. No gross focal neurologic deficits are appreciated.  Skin:  Skin is warm, dry and intact. No rash noted. Psychiatric: Mood and affect are normal. Speech and behavior are normal. Patient exhibits appropriate insight and judgement.   ____________________________________________   LABS (all labs ordered are listed, but only abnormal results are displayed)  Labs Reviewed - No data to display ____________________________________________  EKG   ____________________________________________  RADIOLOGY  No results found.  ____________________________________________    PROCEDURES  Procedure(s) performed:    .Foreign Body Removal Date/Time: 08/09/2018 9:35 AM Performed by: Enid DerryWagner, Vinod Mikesell, PA-C Authorized by: Enid DerryWagner, Veleda Mun, PA-C  Consent: Verbal consent obtained. Risks and benefits: risks, benefits and alternatives were discussed Consent given by: patient Patient understanding: patient states understanding of the procedure being performed Patient identity confirmed: verbally with patient Body area: eye Localization method: eyelid eversion and visualized Removal mechanism: 25-gauge needle and moist cotton swab Eye not examined with fluorescein. No residual rust ring present. Dressing: antibiotic drops Depth: embedded Complexity: complex 1 objects recovered. Post-procedure assessment: foreign body removed Patient tolerance: Patient tolerated the procedure well with no immediate complications .Foreign Body Removal Date/Time: 08/09/2018 9:36 AM Performed by: Enid DerryWagner, Brain Honeycutt, PA-C Authorized by: Enid DerryWagner, Tharon Kitch, PA-C  Consent: Verbal consent obtained. Risks and benefits: risks, benefits and  alternatives were discussed Consent given by: patient Patient understanding: patient states understanding of the procedure being performed Body area: eye Localization method: eyelid eversion and visualized Removal mechanism: 25-gauge needle and moist cotton swab Eye not examined with fluorescein. No residual rust ring present. Dressing: antibiotic drops Depth: embedded Complexity: complex 1 objects recovered. Objects recovered: metal Post-procedure assessment: foreign body removed Patient tolerance: Patient tolerated the procedure well with no immediate complications      Medications  tetracaine (PONTOCAINE) 0.5 % ophthalmic solution 2 drop (2 drops Both Eyes Given by Other 08/09/18 0856)  fluorescein ophthalmic strip 1 strip (1 strip Both Eyes Given by Other 08/09/18 0856)  erythromycin ophthalmic ointment (1 application Both Eyes Given 08/09/18 0919)     ____________________________________________   INITIAL IMPRESSION / ASSESSMENT AND PLAN / ED COURSE  Pertinent labs & imaging results that were available during my care of the patient were reviewed by me and considered in my medical decision making (see chart for details).  Review of the [place] CSRS was performed in accordance of the [facility] prior to dispensing any controlled drugs.  Patient's diagnosis is consistent with bilateral eye foreign bodies.  Metal was removed from eyes bilaterally.  Patient is agreeable  to follow-up with ophthalmology within 48 hours.  Patient will be discharged home with prescriptions for erythromycin. Patient is to follow up with ophthalmology as directed. Patient is given ED precautions to return to the ED for any worsening or new symptoms.     ____________________________________________  FINAL CLINICAL IMPRESSION(S) / ED DIAGNOSES  Final diagnoses:  Foreign body in eye, unspecified laterality, initial encounter      NEW MEDICATIONS STARTED DURING THIS VISIT:  ED Discharge Orders          Ordered    erythromycin ophthalmic ointment  4 times daily     08/09/18 0924              This chart was dictated using voice recognition software/Dragon. Despite best efforts to proofread, errors can occur which can change the meaning. Any change was purely unintentional.    Enid Derry, PA-C 08/09/18 1305    Dionne Bucy, MD 08/09/18 1459

## 2021-12-19 ENCOUNTER — Emergency Department: Payer: Self-pay

## 2021-12-19 ENCOUNTER — Other Ambulatory Visit: Payer: Self-pay

## 2021-12-19 ENCOUNTER — Emergency Department
Admission: EM | Admit: 2021-12-19 | Discharge: 2021-12-19 | Disposition: A | Discharge status: Home / Self Care | Payer: Self-pay | Attending: Emergency Medicine | Admitting: Emergency Medicine

## 2021-12-19 DIAGNOSIS — R404 Transient alteration of awareness: Secondary | ICD-10-CM | POA: Insufficient documentation

## 2021-12-19 DIAGNOSIS — R519 Headache, unspecified: Secondary | ICD-10-CM | POA: Insufficient documentation

## 2021-12-19 DIAGNOSIS — R42 Dizziness and giddiness: Secondary | ICD-10-CM | POA: Insufficient documentation

## 2021-12-19 LAB — HEPATIC FUNCTION PANEL
ALT: 38 U/L (ref 0–44)
AST: 32 U/L (ref 15–41)
Albumin: 4.4 g/dL (ref 3.5–5.0)
Alkaline Phosphatase: 78 U/L (ref 38–126)
Bilirubin, Direct: 0.1 mg/dL (ref 0.0–0.2)
Indirect Bilirubin: 0.9 mg/dL (ref 0.3–0.9)
Total Bilirubin: 1 mg/dL (ref 0.3–1.2)
Total Protein: 7.4 g/dL (ref 6.5–8.1)

## 2021-12-19 LAB — BASIC METABOLIC PANEL
Anion gap: 7 (ref 5–15)
BUN: 18 mg/dL (ref 6–20)
CO2: 27 mmol/L (ref 22–32)
Calcium: 9.5 mg/dL (ref 8.9–10.3)
Chloride: 108 mmol/L (ref 98–111)
Creatinine, Ser: 1.05 mg/dL (ref 0.61–1.24)
GFR, Estimated: >60 mL/min (ref >60)
Glucose, Bld: 141 mg/dL — ABNORMAL HIGH (ref 70–99)
Potassium: 3.6 mmol/L (ref 3.5–5.1)
Sodium: 142 mmol/L (ref 135–145)

## 2021-12-19 LAB — CBC
HCT: 49.3 % (ref 39.0–52.0)
Hemoglobin: 16.1 g/dL (ref 13.0–17.0)
MCH: 28 pg (ref 26.0–34.0)
MCHC: 32.7 g/dL (ref 30.0–36.0)
MCV: 85.9 fL (ref 80.0–100.0)
Platelets: 272 10*3/uL (ref 150–400)
RBC: 5.74 MIL/uL (ref 4.22–5.81)
RDW: 12.9 % (ref 11.5–15.5)
WBC: 6.3 10*3/uL (ref 4.0–10.5)
nRBC: 0 % (ref 0.0–0.2)

## 2021-12-19 MED ORDER — CARBAMAZEPINE ER 200 MG PO TB12: 200.0000 mg | ORAL_TABLET | Freq: Two times a day (BID) | ORAL | 1 refills | Status: AC

## 2021-12-19 MED ORDER — METOCLOPRAMIDE HCL 5 MG/ML IJ SOLN
10.0000 mg | Freq: Once | INTRAMUSCULAR | Status: AC
  Administered 2021-12-19: 10 mg via INTRAVENOUS
  Filled 2021-12-19: qty 2

## 2021-12-19 MED ORDER — KETOROLAC TROMETHAMINE 30 MG/ML IJ SOLN
15.0000 mg | Freq: Once | INTRAMUSCULAR | Status: AC
Start: 2021-12-19 — End: 2021-12-19
  Administered 2021-12-19: 15 mg via INTRAVENOUS
  Filled 2021-12-19: qty 1

## 2021-12-19 MED ORDER — DIPHENHYDRAMINE HCL 50 MG/ML IJ SOLN
25.0000 mg | Freq: Once | INTRAMUSCULAR | Status: AC
Start: 2021-12-19 — End: 2021-12-19
  Administered 2021-12-19: 25 mg via INTRAVENOUS
  Filled 2021-12-19: qty 1

## 2021-12-19 MED ORDER — CARBAMAZEPINE ER 200 MG PO TB12
200.0000 mg | ORAL_TABLET | Freq: Two times a day (BID) | ORAL | Status: DC
  Administered 2021-12-19: 200 mg via ORAL
  Filled 2021-12-19 (×2): qty 1

## 2021-12-19 MED ORDER — GADOBUTROL 1 MMOL/ML IV SOLN
10.0000 mL | Freq: Once | INTRAVENOUS | Status: AC | PRN
Start: 2021-12-19 — End: 2021-12-19
  Administered 2021-12-19: 10 mL via INTRAVENOUS

## 2021-12-19 MED ORDER — SODIUM CHLORIDE 0.9 % IV BOLUS
1000.0000 mL | Freq: Once | INTRAVENOUS | Status: AC
  Administered 2021-12-19: 1000 mL via INTRAVENOUS

## 2021-12-19 NOTE — ED Triage Notes (Signed)
Pt to ED via POV from home. Pt reports yesterday at work he started to feel dizzy. Pt states last thing he remembers is going to get his time sheet. Pt states friends told him he didn't leave he went back in the job site and started talking to coworkers and pt doesn't recall this. Pt states last thing he remembers is trying to leave at 3pm and waking up in his truck at 8pm. Pt also reports HA today and all day yesterday.   Pt states he drink 1 12ox beer daily and denies any drug use.

## 2021-12-19 NOTE — ED Notes (Signed)
DC ppw provided to patient. Pt questions, followup and rx information reviewed. Pt decline vs at time of dc and sent to lobby alert and oriented at this time 

## 2021-12-19 NOTE — ED Notes (Signed)
First Nurse Note: Pt to ED via POV stating that he had a syncopal episode at work yesterday. Pt reports that he has been feeling dizzy and light headed. Pt reports that he did vomit yesterday a few times. Pt is currently in NAD.

## 2021-12-19 NOTE — Discharge Instructions (Signed)
You have been seen in the emergency department today for a likely seizure.  Your workup today including labs are within normal limits.  Please follow up with your doctor as soon as possible regarding today's emergency department visit and your likely seizure.  You will also need to follow up with a neurologist as soon as possible, please call for appointment.  If you have been prescribed a medication for your seizures, please take this medication as prescribed.  As we have discussed it is very important that you DO NOT drive until you have been seen and cleared by your neurologist.  Please drink plenty of fluids, get plenty of sleep and avoid any alcohol or drug use.  Return to the emergency department if you have any further seizures, develop any weakness/numbness of any arm/leg, confusion, slurred speech, or sudden/severe headache.  

## 2021-12-19 NOTE — ED Provider Notes (Signed)
[facility] Provider Note    Event Date/Time   First MD Initiated Contact with Patient 12/19/21 1351     (approximate)   History   Dizziness   HPI  Alejandro York is a 31 y.o. male here with altered mental status.  The patient states that yesterday, after work, he had several hours of the day which she does not remember at all.  He reports he last remembered getting ready to finish work at around 3:30 PM.  He was cleaning up his worksite at that time.  He reportedly then clocked out, went back to his site, then sat in his car.  The next thing he remembers, is being in his car at around 8:00 in the passenger side.  He does not remember anything that happened in between these episodes.  Denies any tongue biting.  No loss of bowel or bladder function.  Of note, patient reportedly had a seizure once as a child, as well as a seizure-like episode once several years ago.  Patient also has a moderate, aching, throbbing, headache.  He has fairly chronic migraine headaches.  No fevers or chills.  No meningismus.  No neck stiffness.  No recent medication changes.  No regular alcohol or drug use.     Physical Exam   Triage Vital Signs: ED Triage Vitals  Enc Vitals Group     BP 12/19/21 1210 (!) 130/102     Pulse Rate 12/19/21 1208 84     Resp 12/19/21 1255 18     Temp 12/19/21 1208 98 F (36.7 C)     Temp Source 12/19/21 1208 Oral     SpO2 12/19/21 1255 98 %     Weight --      Height 12/19/21 1208 5\' 9"  (1.753 m)     Head Circumference --      Peak Flow --      Pain Score 12/19/21 1208 0     Pain Loc --      Pain Edu? --      Excl. in [place]? --     Most recent vital signs: Vitals:   12/19/21 1255 12/19/21 1551  BP:  112/72  Pulse:  80  Resp: 18 18  Temp:    SpO2: 98% 98%     General: Awake, no distress.  CV:  Good peripheral perfusion.  Resp:  Normal effort.  Abd:  No distention.  Other:  Cranial nerves II through XII intact.  Strength 5 out of 5  bilateral upper and lower extremities.  Normal sensation to light touch.  Gait is normal.   ED Results / Procedures / Treatments   Labs (all labs ordered are listed, but only abnormal results are displayed) Labs Reviewed  BASIC METABOLIC PANEL - Abnormal; Notable for the following components:      Result Value   Glucose, Bld 141 (*)    All other components within normal limits  CBC  HEPATIC FUNCTION PANEL  URINALYSIS, ROUTINE W REFLEX MICROSCOPIC  CBG MONITORING, ED     EKG Normal sinus rhythm, ventricular rate 83.  PR 132, QRS 106, QTc 437.  No acute ST elevations or depressions.   RADIOLOGY MRI with and without: Normal brain MRI   I also independently reviewed and agree with radiologist interpretations.   PROCEDURES:  Critical Care performed: No    MEDICATIONS ORDERED IN ED: Medications  carbamazepine (TEGRETOL XR) 12 hr tablet 200 mg (200 mg Oral Given 12/19/21 1552)  metoCLOPramide (REGLAN)  injection 10 mg (10 mg Intravenous Given 12/19/21 1442)  diphenhydrAMINE (BENADRYL) injection 25 mg (25 mg Intravenous Given 12/19/21 1435)  ketorolac (TORADOL) 30 MG/ML injection 15 mg (15 mg Intravenous Given 12/19/21 1442)  sodium chloride 0.9 % bolus 1,000 mL (0 mLs Intravenous Stopping previously hung infusion 12/19/21 1551)  gadobutrol (GADAVIST) 1 MMOL/ML injection 10 mL (10 mLs Intravenous Contrast Given 12/19/21 1636)     IMPRESSION / MDM / ASSESSMENT AND PLAN / ED COURSE  I reviewed the triage vital signs and the nursing notes.                               The patient is on the cardiac monitor to evaluate for evidence of arrhythmia and/or significant heart rate changes.   Ddx:  Differential includes the following, with pertinent life- or limb-threatening emergencies considered:  Partial complex seizure, complicated migraine, stress reaction, psychosomatic disorder, conversion disorder, less likely substance use  Patient's presentation is most consistent with acute  presentation with potential threat to life or bodily function.  MDM:  31 year old male here with transient episode of amnesia yesterday.  Clinically, with his history, concern for possible atypical partial complex seizure versus pseudoseizure.  Differential also includes conversion disorder, also consideration of possible complicated migraine.  Patient given migraine meds here with improvement.  He has no focal neurological deficits.  MRI obtained, with and without contrast, and shows no evidence of acute intracranial abnormality.  No evidence of mass.  CBC unremarkable.  BMP with normal electrolytes.  LFTs are normal.  Dr. Quinn Axe with neurology consulted and evaluated the patient in the ED.  He will be started on carbamazepine with outpatient neurology follow-up.  Seizure precautions were discussed in detail including no driving or operating heavy machinery.  Work note provided.   MEDICATIONS GIVEN IN ED: Medications  carbamazepine (TEGRETOL XR) 12 hr tablet 200 mg (200 mg Oral Given 12/19/21 1552)  metoCLOPramide (REGLAN) injection 10 mg (10 mg Intravenous Given 12/19/21 1442)  diphenhydrAMINE (BENADRYL) injection 25 mg (25 mg Intravenous Given 12/19/21 1435)  ketorolac (TORADOL) 30 MG/ML injection 15 mg (15 mg Intravenous Given 12/19/21 1442)  sodium chloride 0.9 % bolus 1,000 mL (0 mLs Intravenous Stopping previously hung infusion 12/19/21 1551)  gadobutrol (GADAVIST) 1 MMOL/ML injection 10 mL (10 mLs Intravenous Contrast Given 12/19/21 1636)     Consults:  Dr. Quinn Axe, neurology   EMR reviewed  Previous ED visits     FINAL CLINICAL IMPRESSION(S) / ED DIAGNOSES   Final diagnoses:  Altered awareness, transient  Acute nonintractable headache, unspecified headache type     Rx / DC Orders   ED Discharge Orders          Ordered    carbamazepine (TEGRETOL-XR) 200 MG 12 hr tablet  2 times daily        12/19/21 1714             Note:  This document was prepared using Dragon voice  recognition software and may include unintentional dictation errors.   Duffy Bruce, MD 12/19/21 1719

## 2022-05-25 ENCOUNTER — Other Ambulatory Visit: Payer: Self-pay

## 2022-05-25 MED ORDER — SERTRALINE HCL 50 MG PO TABS
50.0000 mg | ORAL_TABLET | Freq: Every day | ORAL | 1 refills | Status: AC
  Filled 2022-05-25: qty 30, 30d supply, fill #0

## 2022-09-13 ENCOUNTER — Other Ambulatory Visit: Payer: Self-pay

## 2022-09-13 ENCOUNTER — Encounter: Payer: Self-pay | Admitting: Emergency Medicine

## 2022-09-13 ENCOUNTER — Emergency Department: Payer: Medicaid Other

## 2022-09-13 ENCOUNTER — Emergency Department
Admission: EM | Admit: 2022-09-13 | Discharge: 2022-09-13 | Disposition: A | Discharge status: Home / Self Care | Payer: Medicaid Other | Attending: Emergency Medicine | Admitting: Emergency Medicine

## 2022-09-13 DIAGNOSIS — N200 Calculus of kidney: Secondary | ICD-10-CM | POA: Insufficient documentation

## 2022-09-13 DIAGNOSIS — R1032 Left lower quadrant pain: Secondary | ICD-10-CM | POA: Diagnosis present

## 2022-09-13 LAB — BASIC METABOLIC PANEL
Anion gap: 12 (ref 5–15)
BUN: 26 mg/dL — ABNORMAL HIGH (ref 6–20)
CO2: 23 mmol/L (ref 22–32)
Calcium: 9.7 mg/dL (ref 8.9–10.3)
Chloride: 100 mmol/L (ref 98–111)
Creatinine, Ser: 1.4 mg/dL — ABNORMAL HIGH (ref 0.61–1.24)
GFR, Estimated: >60 mL/min (ref >60)
Glucose, Bld: 128 mg/dL — ABNORMAL HIGH (ref 70–99)
Potassium: 4.3 mmol/L (ref 3.5–5.1)
Sodium: 135 mmol/L (ref 135–145)

## 2022-09-13 LAB — URINALYSIS, ROUTINE W REFLEX MICROSCOPIC
Bacteria, UA: NONE SEEN
Bilirubin Urine: NEGATIVE
Glucose, UA: NEGATIVE mg/dL
Ketones, ur: NEGATIVE mg/dL
Leukocytes,Ua: NEGATIVE
Nitrite: NEGATIVE
Protein, ur: 30 mg/dL — AB
RBC / HPF: >50 RBC/hpf (ref 0–5)
Specific Gravity, Urine: 1.028 (ref 1.005–1.030)
Squamous Epithelial / HPF: NONE SEEN /HPF (ref 0–5)
pH: 9 — ABNORMAL HIGH (ref 5.0–8.0)

## 2022-09-13 LAB — CBC WITH DIFFERENTIAL/PLATELET
Abs Immature Granulocytes: 0.05 10*3/uL (ref 0.00–0.07)
Basophils Absolute: 0.1 10*3/uL (ref 0.0–0.1)
Basophils Relative: 1 %
Eosinophils Absolute: 0 10*3/uL (ref 0.0–0.5)
Eosinophils Relative: 0 %
HCT: 49.1 % (ref 39.0–52.0)
Hemoglobin: 16.3 g/dL (ref 13.0–17.0)
Immature Granulocytes: 0 %
Lymphocytes Relative: 11 %
Lymphs Abs: 1.3 10*3/uL (ref 0.7–4.0)
MCH: 28.2 pg (ref 26.0–34.0)
MCHC: 33.2 g/dL (ref 30.0–36.0)
MCV: 84.9 fL (ref 80.0–100.0)
Monocytes Absolute: 0.8 10*3/uL (ref 0.1–1.0)
Monocytes Relative: 7 %
Neutro Abs: 9.7 10*3/uL — ABNORMAL HIGH (ref 1.7–7.7)
Neutrophils Relative %: 81 %
Platelets: 325 10*3/uL (ref 150–400)
RBC: 5.78 MIL/uL (ref 4.22–5.81)
RDW: 12.7 % (ref 11.5–15.5)
WBC: 11.9 10*3/uL — ABNORMAL HIGH (ref 4.0–10.5)
nRBC: 0 % (ref 0.0–0.2)

## 2022-09-13 MED ORDER — ONDANSETRON 4 MG PO TBDP: 4.0000 mg | ORAL_TABLET | Freq: Three times a day (TID) | ORAL | 0 refills | Status: AC | PRN

## 2022-09-13 MED ORDER — OXYCODONE HCL 5 MG PO TABS: 5.0000 mg | ORAL_TABLET | Freq: Four times a day (QID) | ORAL | 0 refills | Status: AC | PRN

## 2022-09-13 MED ORDER — ONDANSETRON HCL 4 MG/2ML IJ SOLN
4.0000 mg | Freq: Once | INTRAMUSCULAR | Status: AC
  Administered 2022-09-13: 4 mg via INTRAVENOUS
  Filled 2022-09-13: qty 2

## 2022-09-13 MED ORDER — TAMSULOSIN HCL 0.4 MG PO CAPS: 0.4000 mg | ORAL_CAPSULE | Freq: Every day | ORAL | 0 refills | Status: AC

## 2022-09-13 MED ORDER — HYDROMORPHONE HCL 1 MG/ML IJ SOLN
0.5000 mg | Freq: Once | INTRAMUSCULAR | Status: AC
  Administered 2022-09-13: 0.5 mg via INTRAVENOUS
  Filled 2022-09-13: qty 0.5

## 2022-09-13 MED ORDER — SODIUM CHLORIDE 0.9 % IV BOLUS
1000.0000 mL | Freq: Once | INTRAVENOUS | Status: AC
  Administered 2022-09-13: 1000 mL via INTRAVENOUS

## 2022-09-13 NOTE — Discharge Instructions (Signed)
You were seen in the emergency department and diagnosed with a kidney stone.  It is importantly follow-up closely with urology.  Return to the emergency department if you have worsening symptoms.  Pain control:  Ibuprofen (motrin/aleve/advil) - You can take 3-4 tablets (600-800 mg) every 6 hours as needed for pain/fever.  Acetaminophen (tylenol) - You can take 2 extra strength tablets (1000 mg) every 6 hours as needed for pain/fever.  You can alternate these medications or take them together.  Make sure you eat food/drink water when taking these medications.  If you are still hurting you can take an opioid pain medication, only take if you are in severe pain.  Percocet - you were given a prescription for narcotic pain medications.   These are very addictive medications.  These medications can make you constipated.  If you need to take more than 1-2 doses, start a stool softner.  If you become constipated, take 1-2 capfull of MiraLAX mixed in 16 oz of water, can repeat untill having regular bowel movements.  Keep this medication out of reach of any children.  Flomax (tamsulosin) -this medication will make you urinate more frequently, it is importantly stay hydrated with water while taking this medication  zofran (ondansetron) - nausea medication, take 1 tablet every 8 hours as needed for nausea/vomiting.

## 2022-09-13 NOTE — ED Triage Notes (Signed)
Pt via [facility] from home. Pt c/o lower abd pain that radiates down to his R scrotum. States she gets kidney stones monthly but this is the worse it has ever been. Pt states pain started this AM around 2 hours ago. Pain is 10/10. Pt is A&OX4 but is moaning and groaning in pain. Dr. Jori Moll EDP at bedside at this time.

## 2022-09-13 NOTE — ED Provider Notes (Signed)
[facility] Provider Note    Event Date/Time   First MD Initiated Contact with Patient 09/13/22 1030     (approximate)   History   Abdominal Pain   HPI  Joandy Tsuchida is a 32 y.o. male history significant for prior kidney stone, presents to the emergency department with symptoms concerning for kidney stone.  States that he had a sudden onset of severe pain to his left lower abdomen that radiated to his testicle and groin.  Started approximately 2 hours ago.  Significant pain with urination and feels like he is having a lot of pressure when he tries to urinate.  Noticed blood in his urine.  Prior history of kidney stones.  States that this feels worse than prior kidney stones.  Denies any falls or trauma.  Does endorse nausea but no episodes of vomiting.     Physical Exam   Triage Vital Signs: ED Triage Vitals  Enc Vitals Group     BP      Pulse      Resp      Temp      Temp src      SpO2      Weight      Height      Head Circumference      Peak Flow      Pain Score      Pain Loc      Pain Edu?      Excl. in [place]?     Most recent vital signs: Vitals:   09/13/22 1041  BP: (!) 142/86  Pulse: 66  Resp: 18  Temp: 97.6 F (36.4 C)  SpO2: 95%    Physical Exam Constitutional:      General: He is in acute distress.     Appearance: He is well-developed.  HENT:     Head: Atraumatic.  Eyes:     Conjunctiva/sclera: Conjunctivae normal.  Cardiovascular:     Rate and Rhythm: Regular rhythm.  Pulmonary:     Effort: No respiratory distress.  Abdominal:     Tenderness: There is no right CVA tenderness or left CVA tenderness.  Musculoskeletal:        General: Normal range of motion.     Cervical back: Normal range of motion.  Skin:    General: Skin is warm.  Neurological:     Mental Status: He is alert. Mental status is at baseline.     IMPRESSION / MDM / ASSESSMENT AND PLAN / ED COURSE  I reviewed the triage vital signs and the nursing  notes.  Differential diagnosis including kidney stone, pyelonephritis, testicular torsion  EKG No tachycardic or bradycardic dysrhythmias while on cardiac telemetry.  RADIOLOGY I independently reviewed imaging, my interpretation of imaging: CT abdomen and pelvis  LABS (all labs ordered are listed, but only abnormal results are displayed) Labs interpreted as -    Labs Reviewed  CBC WITH DIFFERENTIAL/PLATELET - Abnormal; Notable for the following components:      Result Value   WBC 11.9 (*)    Neutro Abs 9.7 (*)    All other components within normal limits  BASIC METABOLIC PANEL - Abnormal; Notable for the following components:   Glucose, Bld 128 (*)    BUN 26 (*)    Creatinine, Ser 1.40 (*)    All other components within normal limits  URINALYSIS, ROUTINE W REFLEX MICROSCOPIC - Abnormal; Notable for the following components:   Color, Urine YELLOW (*)  APPearance HAZY (*)    pH 9.0 (*)    Hgb urine dipstick MODERATE (*)    Protein, ur 30 (*)    All other components within normal limits    TREATMENT  IV fluids, IV Zofran, IV Dilaudid  MDM  Reevaluation had significant improvement of his pain.  CT scan read as bilateral punctate nonobstructing renal stones.  Punctate calcification low in the peritoneum along the posterior urethra.  Patient passing kidney stones in the emergency department.  This is likely another kidney stone.  Discharge the patient with symptomatic treatment.  Discussed follow-up with urology.  Discussed dietary changes at length.     PROCEDURES:  Critical Care performed: No  Procedures  Patient's presentation is most consistent with acute presentation with potential threat to life or bodily function.   MEDICATIONS ORDERED IN ED: Medications  HYDROmorphone (DILAUDID) injection 0.5 mg (0.5 mg Intravenous Given 09/13/22 1047)  sodium chloride 0.9 % bolus 1,000 mL (1,000 mLs Intravenous New Bag/Given 09/13/22 1049)  ondansetron (ZOFRAN) injection 4  mg (4 mg Intravenous Given 09/13/22 1046)    FINAL CLINICAL IMPRESSION(S) / ED DIAGNOSES   Final diagnoses:  Kidney stones     Rx / DC Orders   ED Discharge Orders          Ordered    tamsulosin (FLOMAX) 0.4 MG CAPS capsule  Daily        09/13/22 1144    ondansetron (ZOFRAN-ODT) 4 MG disintegrating tablet  Every 8 hours PRN        09/13/22 1144    oxyCODONE (ROXICODONE) 5 MG immediate release tablet  Every 6 hours PRN        09/13/22 1144             Note:  This document was prepared using Dragon voice recognition software and may include unintentional dictation errors.   Nathaniel Man, MD 09/13/22 1144

## 2022-09-13 NOTE — ED Notes (Signed)
Pt up to the bathroom at this time, states that he passed a couple of kidney stones in the toilet, but states that he still feels pain at this time. Dr. Jori Moll at bedside at this time.

## 2022-10-01 ENCOUNTER — Other Ambulatory Visit: Payer: Self-pay | Admitting: Physician Assistant

## 2022-10-01 DIAGNOSIS — M7541 Impingement syndrome of right shoulder: Secondary | ICD-10-CM

## 2022-10-12 ENCOUNTER — Encounter: Payer: Self-pay | Admitting: Urology

## 2022-10-12 ENCOUNTER — Ambulatory Visit: Payer: Self-pay | Admitting: Urology

## 2023-06-18 ENCOUNTER — Telehealth: Payer: Self-pay | Admitting: Emergency Medicine

## 2023-06-18 ENCOUNTER — Emergency Department
Admission: EM | Admit: 2023-06-18 | Discharge: 2023-06-18 | Discharge status: Against Medical Advice | Payer: MEDICAID | Attending: Emergency Medicine | Admitting: Emergency Medicine

## 2023-06-18 ENCOUNTER — Other Ambulatory Visit: Payer: Self-pay

## 2023-06-18 ENCOUNTER — Emergency Department: Payer: MEDICAID

## 2023-06-18 ENCOUNTER — Encounter: Payer: Self-pay | Admitting: Emergency Medicine

## 2023-06-18 DIAGNOSIS — R109 Unspecified abdominal pain: Secondary | ICD-10-CM | POA: Insufficient documentation

## 2023-06-18 DIAGNOSIS — Z5321 Procedure and treatment not carried out due to patient leaving prior to being seen by health care provider: Secondary | ICD-10-CM | POA: Diagnosis not present

## 2023-06-18 LAB — CBC WITH DIFFERENTIAL/PLATELET
Abs Immature Granulocytes: 0.03 10*3/uL (ref 0.00–0.07)
Basophils Absolute: 0 10*3/uL (ref 0.0–0.1)
Basophils Relative: 1 %
Eosinophils Absolute: 0.3 10*3/uL (ref 0.0–0.5)
Eosinophils Relative: 4 %
HCT: 43.9 % (ref 39.0–52.0)
Hemoglobin: 14.2 g/dL (ref 13.0–17.0)
Immature Granulocytes: 0 %
Lymphocytes Relative: 20 %
Lymphs Abs: 1.7 10*3/uL (ref 0.7–4.0)
MCH: 28.2 pg (ref 26.0–34.0)
MCHC: 32.3 g/dL (ref 30.0–36.0)
MCV: 87.3 fL (ref 80.0–100.0)
Monocytes Absolute: 0.8 10*3/uL (ref 0.1–1.0)
Monocytes Relative: 9 %
Neutro Abs: 5.4 10*3/uL (ref 1.7–7.7)
Neutrophils Relative %: 66 %
Platelets: 279 10*3/uL (ref 150–400)
RBC: 5.03 MIL/uL (ref 4.22–5.81)
RDW: 12.9 % (ref 11.5–15.5)
WBC: 8.2 10*3/uL (ref 4.0–10.5)
nRBC: 0 % (ref 0.0–0.2)

## 2023-06-18 LAB — URINALYSIS, ROUTINE W REFLEX MICROSCOPIC
Bilirubin Urine: NEGATIVE
Glucose, UA: NEGATIVE mg/dL
Ketones, ur: NEGATIVE mg/dL
Leukocytes,Ua: NEGATIVE
Nitrite: NEGATIVE
Protein, ur: NEGATIVE mg/dL
RBC / HPF: >50 RBC/hpf (ref 0–5)
Specific Gravity, Urine: 1.018 (ref 1.005–1.030)
Squamous Epithelial / HPF: 0 /[HPF] (ref 0–5)
pH: 7 (ref 5.0–8.0)

## 2023-06-18 LAB — BASIC METABOLIC PANEL
Anion gap: 10 (ref 5–15)
BUN: 20 mg/dL (ref 6–20)
CO2: 21 mmol/L — ABNORMAL LOW (ref 22–32)
Calcium: 9 mg/dL (ref 8.9–10.3)
Chloride: 107 mmol/L (ref 98–111)
Creatinine, Ser: 0.97 mg/dL (ref 0.61–1.24)
GFR, Estimated: >60 mL/min (ref >60)
Glucose, Bld: 93 mg/dL (ref 70–99)
Potassium: 4.3 mmol/L (ref 3.5–5.1)
Sodium: 138 mmol/L (ref 135–145)

## 2023-06-18 NOTE — ED Triage Notes (Signed)
Patient to ED via POV for left sided flank pain. Started two days ago. Having difficulty urinating. Hx of kidney stones

## 2023-06-18 NOTE — ED Provider Triage Note (Signed)
Emergency Medicine Provider Triage Evaluation Note  Alejandro York , a 32 y.o. male  was evaluated in triage.  Pt complains of light sided abdominal pain and flank pain x2 days. Difficulty urinating. History of stones, this feels the same  Review of Systems  Positive: Left flank pain, dysuria Negative: Diarrhea/vomiting  Physical Exam  BP 124/86   Pulse 74   Temp 97.9 F (36.6 C) (Oral)   Resp 18   Ht 5\' 8"  (1.727 m)   Wt 77.1 kg   SpO2 100%   BMI 25.85 kg/m  Gen:   Awake, no distress   Resp:  Normal effort  MSK:   Moves extremities without difficulty  Other:    Medical Decision Making  Medically screening exam initiated at 7:24 AM.  Appropriate orders placed.  Shayden Vangorden was informed that the remainder of the evaluation will be completed by another provider, this initial triage assessment does not replace that evaluation, and the importance of remaining in the ED until their evaluation is complete.     Jackelyn Hoehn, PA-C 06/18/23 [phone]

## 2023-06-18 NOTE — Telephone Encounter (Signed)
Called patient due to left emergency department before provider exam to inquire about condition and follow up plans. The number on file is not in service.
